# Patient Record
Sex: Male | Born: 1980 | Race: White | Hispanic: No | Marital: Married | Smoking: Current every day smoker
Health system: Southern US, Community
[De-identification: ages and names within clinical notes are randomized; demographics above are authoritative.]

## PROBLEM LIST (undated history)

## (undated) DIAGNOSIS — Z789 Other specified health status: Secondary | ICD-10-CM

## (undated) HISTORY — PX: INCISION AND DRAINAGE ABSCESS ANAL: SUR669

---
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
## 1999-04-26 ENCOUNTER — Emergency Department (HOSPITAL_COMMUNITY): Admission: EM | Admit: 1999-04-26 | Discharge: 1999-04-26 | Payer: Self-pay

## 1999-04-26 ENCOUNTER — Encounter: Payer: Self-pay | Admitting: Emergency Medicine

## 1999-05-02 ENCOUNTER — Ambulatory Visit (HOSPITAL_COMMUNITY): Admission: RE | Admit: 1999-05-02 | Discharge: 1999-05-02 | Payer: Self-pay

## 2004-07-24 HISTORY — PX: ANTERIOR CRUCIATE LIGAMENT REPAIR: SHX115

## 2007-02-01 ENCOUNTER — Encounter: Admission: RE | Admit: 2007-02-01 | Discharge: 2007-02-01 | Payer: Self-pay | Admitting: Orthopedic Surgery

## 2007-02-26 ENCOUNTER — Ambulatory Visit (HOSPITAL_COMMUNITY): Admission: RE | Admit: 2007-02-26 | Discharge: 2007-02-27 | Payer: Self-pay | Admitting: Orthopedic Surgery

## 2010-12-06 NOTE — Op Note (Signed)
Allen [name]                  ACCOUNT NO.:  [id number]   MEDICAL RECORD NO.:  [id number]          PATIENT TYPE:  OIB   LOCATION:  5038                         FACILITY:  [facility]   PHYSICIAN:  Burnard Bunting, M.D.    DATE OF BIRTH:  Jul 24, 1981   DATE OF PROCEDURE:  02/26/2007  DATE OF DISCHARGE:                               OPERATIVE REPORT   PREOPERATIVE DIAGNOSIS:  Right knee anterior cruciate ligament, loose  bodies, medial meniscal tear.   POSTOPERATIVE DIAGNOSIS:  Right knee anterior cruciate ligament, loose  bodies, medial meniscal tear.   PROCEDURE:  1. Right knee anterior cruciate ligament reconstruction.  2. Removal of loose bodies x5  each measuring greater than 5 x 5 mm.  3. Microfracture medial femoral condyle for full thickness chondral      defects.  4. Inside-out medial meniscal tear repair.   SURGEON:  Burnard Bunting, M.D.   ASSISTANT:  Jerolyn Shin. Tresa Res, M.D.   ANESTHESIA:  General endotracheal.   ESTIMATED BLOOD LOSS:  30 mL.   DRAINS:  None.   TOURNIQUET TIME:  2 hours at 300 mmHg.   INDICATIONS:  Allen [name] is a 30 year old patient with severe right knee  injury, MRI scan consistent with meniscal tear, loose bodies, chondral  defect and medial meniscal tear and ACL tear.  He presents now for  operative management after symptomatic knee instability.   OPERATIVE FINDINGS:  1. Examination under anesthesia range of motion 0 to 130 with      stability to varus-valgus stress PCL intact.  No __________      instability is noted.  ACL without, with about 4 mm anterior drawer      with a soft endpoint.  2. Diagnostic arthroscopy.  3. Intact patellofemoral compartment.  4. A significant loose bodies within the anterior compartment and in      the lateral gutter and the lateral compartment, approximately five      of these bodies, each measuring each measuring minimal 5 x 5, the      large measuring one x 1 cm.  5. Intact lateral compartment articular  cartilage and meniscus but      with two loose bodies present.  6. Two loose bodies in the anterior compartment with torn ACL and      intact PCL.  7. Tear of the medial meniscus through the red and white zone      involving about 70% of the anterior-posterior width of the meniscus      in the posterior horn with favorable configuration for repair.   PROCEDURE IN DETAIL:  Patient was brought to operating room where  general endotracheal anesthesia was used.  Preoperative antibiotics  administered.  The right leg was prepped with DuraPrep solution and  draped in a sterile manner including the foot.  Operative fields covered  with Ioban.  The leg was elevated and exsanguinated with the Esmarch  wrap.  Tourniquet was inflated.  The foot of the bed was dropped, the  left leg was in lithotomy position and the heel and peroneal nerve well-  padded.  Because of the patient's instability, an anterior incision  beginning at the inferior pole of patella and extending to the tibial  tubercle was made.  Skin and subcutaneous tissue sharply divided.  Tenon  was split.  The graft was harvested using a double wide 10 mm knife.  Bone plugs were fashioned 10 x 23 mm, one #5 Ethibond was placed in the  __________ of what would be the femoral bone plug.  Two #2 FiberWires  were placed through what would be the tibial bone plug.  Patellar defect  was bone grafted, patellar tendon was closed, peritenon was closed.  Through the incision the anterior inferolateral portals were  established.  Anterior inferior medial portals were established under  visualization.  Diagnostic arthroscopy was performed.  Loose bodies x5  were removed to go through a separate medial portal incision.  The loose  bodies were large and came from a chondral defect on the medial femoral  condyle.  Patellofemoral compartment was intact.  Loose bodies were  present in the lateral compartment.  The articular cartilage and  meniscus was  intact. ACL was 95% torn.  Stump was debrided, over-the-top  position identified.  Notchplasty was performed.  Medial meniscal tear  was then inspected and found to be potentially repairable especially in  the light of two significant chondral defects, each measuring about 1 x  1 cm in the posterior weightbearing surface of the medial femoral  condyle.  For this reason it was elected to try to repair that medial  meniscus.  The rasp was then placed, the junction of the meniscal horns  and the tear.  Fibrous tissue was removed.  Chondral flaps were then  debrided off of the medial femoral condyle.  Microfracture was performed  after removing calcified chondral cartilage layer of the exposed bone.  The circumference of the majority of chondral lesions were well  shouldered.  At this time incision was made in the posteromedial aspect  of the knee.  Skin and subcutaneous tissue were sharply divided.  The  knee incision was centered at the joint line.  Care was taken to avoid  injury to branches of the saphenous nerve.  Pes tendon was split.  Pes  anserine tendon was split longitudinally.  Graves retractor was placed.  The joint line was then visualized.  Four vertical mattress sutures were  placed inside-out fashion using a 2-0 FiberWire suture.  These sutures  were then tied with the knee in about 10 degrees of extension.  This  incision was then irrigated and closed using interrupted inverted 0-0  Vicryl suture, 2-0 Vicryl suture and then running 3-0 pullout Prolene.  At this time attention was redirected towards the drilling of the tibial  tunnel which was made in about a 50 degrees coronal angle to the  perpendicular axis of the tibia, 10-mm tunnel was drilled over a guide  pin.  Femoral tunnel was then placed at about the 10 o'clock position.  The back wall was visualized and found to be intact.  The graft was  passed and secured on femoral side using 8 x 23 mm bioabsorbable screw.  The  patient had good range of motion and stability and good graft  isometry.  The tibial bone plug was then secured to the tibia tying  sutures over 6.5 x 30 mm post.  At this time the knee was taken through  a range of motion and found to be stable with no impingement of the  graft in full extension.  Tourniquet was released.  Bleeding points  encountered were controlled using electrocautery.  Knee joint was  thoroughly irrigated.  Knee joint was then closed using interrupted  inverted 0-0 Vicryl suture, 2-0 Vicryl suture and 3-0 pullout Prolene.  Solution of Marcaine, morphine and clonidine was injected into the knee.  Bulky dressing and knee immobilizer were applied.  The patient tolerated  well without immediate complications.  It should be noted that Dr.  Lenny Pastel assistance was of medical necessity during this case due to  the complexity of the procedure and for retraction of important  neurovascular structures.      Burnard Bunting, M.D.  Electronically Signed     GSD/MEDQ  D:  02/26/2007  T:  02/27/2007  Job:  [id number]

## 2011-05-08 LAB — CBC
MCHC: 34.5
MCV: 89.5
Platelets: 181
RDW: 12.6

## 2011-05-08 LAB — BASIC METABOLIC PANEL
BUN: 10
CO2: 27
Calcium: 9.6
Creatinine, Ser: 0.92
GFR calc Af Amer: >60
Glucose, Bld: 85

## 2011-05-08 LAB — DIFFERENTIAL
Basophils Absolute: 0
Basophils Relative: 0
Eosinophils Absolute: 0.2
Neutro Abs: 7.9 — ABNORMAL HIGH
Neutrophils Relative %: 71

## 2017-10-30 ENCOUNTER — Other Ambulatory Visit: Payer: Self-pay | Admitting: Physician Assistant

## 2017-10-30 DIAGNOSIS — N5089 Other specified disorders of the male genital organs: Secondary | ICD-10-CM

## 2017-10-31 ENCOUNTER — Ambulatory Visit
Admission: RE | Admit: 2017-10-31 | Discharge: 2017-10-31 | Disposition: A | Discharge status: Home / Self Care | Payer: BLUE CROSS/BLUE SHIELD | Source: Ambulatory Visit | Attending: Physician Assistant | Admitting: Physician Assistant

## 2017-10-31 DIAGNOSIS — N5089 Other specified disorders of the male genital organs: Secondary | ICD-10-CM

## 2020-09-14 ENCOUNTER — Other Ambulatory Visit: Payer: Self-pay | Admitting: Physician Assistant

## 2020-09-14 DIAGNOSIS — Q740 Other congenital malformations of upper limb(s), including shoulder girdle: Secondary | ICD-10-CM

## 2020-09-29 ENCOUNTER — Ambulatory Visit
Admission: RE | Admit: 2020-09-29 | Discharge: 2020-09-29 | Disposition: A | Discharge status: Home / Self Care | Payer: 59 | Source: Ambulatory Visit | Attending: Physician Assistant | Admitting: Physician Assistant

## 2020-09-29 ENCOUNTER — Other Ambulatory Visit: Payer: Self-pay

## 2020-09-29 DIAGNOSIS — Q740 Other congenital malformations of upper limb(s), including shoulder girdle: Secondary | ICD-10-CM

## 2021-03-15 ENCOUNTER — Ambulatory Visit: Payer: Self-pay | Admitting: General Surgery

## 2021-03-15 NOTE — H&P (View-Only) (Signed)
  REFERRING PHYSICIAN:  Self  PROVIDER:  Elenora Gamma, MD  MRN: [id number] DOB: 1981-06-23 DATE OF ENCOUNTER: 03/15/2021  Subjective   Chief Complaint: anal lesion     History of Present Illness: Allen [name] is a 40 y.o. male who is seen today for evaluation of an anal lesion.  Patient states he has a history of a perirectal abscess status post I&D several years ago.  It did not give him any further issues until recently when he developed some significant perianal pain after riding jet skis.  He saw our PA here in the office and clot evacuation was performed.  She was concerned for possible perianal fistula as well and wanted me to evaluate him further.  He states that his pain resolved quite quickly after evacuation of blood clots.  He denies any straining prior to the event.  He reports regular bowel habits.    Review of Systems: A complete review of systems was obtained from the patient.  I have reviewed this information and discussed as appropriate with the patient.  See HPI as well for other ROS.    Medical History: History reviewed. No pertinent past medical history.  There is no problem list on file for this patient.   Past Surgical History:  Procedure Laterality Date   ARTHROSCOPIC REPAIR ACL       No Known Allergies  No current outpatient medications on file prior to visit.   No current facility-administered medications on file prior to visit.    Family History  Problem Relation Age of Onset   Diabetes Mother      Social History   Tobacco Use  Smoking Status Current Every Day Smoker   Packs/day: 1.00   Types: Cigarettes  Smokeless Tobacco Never Used     Social History   Socioeconomic History   Marital status: Unknown  Tobacco Use   Smoking status: Current Every Day Smoker    Packs/day: 1.00    Types: Cigarettes   Smokeless tobacco: Never Used  Substance and Sexual Activity   Alcohol use: Never   Drug use: Never    Objective:     There were no vitals filed for this visit.   Exam Gen: NAD CV: RRR Lungs: CTA Abd: soft Rectal: Right anterior punctate lesion.  Fistula probe inserted but does not traverse towards the anal canal.  No signs of active infection or drainage.   Assessment and Plan:  Diagnoses and all orders for this visit:  Anal lesion    I believe this to be a sebaceous cyst although we did discuss the possibility of this being an an anal rectal fistula.  I recommended exam under anesthesia with excision if it is a cyst and fistulotomy if it is a shallow fistula.  If a deeper fistula is noted we will place a seton.  We discussed this in detail including postoperative pain and healing times.  All questions were answered.  Risk include bleeding, pain and recurrence.

## 2021-03-15 NOTE — H&P (Signed)
  REFERRING PHYSICIAN:  Self  PROVIDER:  Elenora Gamma, MD  MRN: [id number] DOB: 1981-06-23 DATE OF ENCOUNTER: 03/15/2021  Subjective   Chief Complaint: anal lesion     History of Present Illness: Allen [name] is a 40 y.o. male who is seen today for evaluation of an anal lesion.  Patient states he has a history of a perirectal abscess status post I&D several years ago.  It did not give him any further issues until recently when he developed some significant perianal pain after riding jet skis.  He saw our PA here in the office and clot evacuation was performed.  She was concerned for possible perianal fistula as well and wanted me to evaluate him further.  He states that his pain resolved quite quickly after evacuation of blood clots.  He denies any straining prior to the event.  He reports regular bowel habits.    Review of Systems: A complete review of systems was obtained from the patient.  I have reviewed this information and discussed as appropriate with the patient.  See HPI as well for other ROS.    Medical History: History reviewed. No pertinent past medical history.  There is no problem list on file for this patient.   Past Surgical History:  Procedure Laterality Date   ARTHROSCOPIC REPAIR ACL       No Known Allergies  No current outpatient medications on file prior to visit.   No current facility-administered medications on file prior to visit.    Family History  Problem Relation Age of Onset   Diabetes Mother      Social History   Tobacco Use  Smoking Status Current Every Day Smoker   Packs/day: 1.00   Types: Cigarettes  Smokeless Tobacco Never Used     Social History   Socioeconomic History   Marital status: Unknown  Tobacco Use   Smoking status: Current Every Day Smoker    Packs/day: 1.00    Types: Cigarettes   Smokeless tobacco: Never Used  Substance and Sexual Activity   Alcohol use: Never   Drug use: Never    Objective:     There were no vitals filed for this visit.   Exam Gen: NAD CV: RRR Lungs: CTA Abd: soft Rectal: Right anterior punctate lesion.  Fistula probe inserted but does not traverse towards the anal canal.  No signs of active infection or drainage.   Assessment and Plan:  Diagnoses and all orders for this visit:  Anal lesion    I believe this to be a sebaceous cyst although we did discuss the possibility of this being an an anal rectal fistula.  I recommended exam under anesthesia with excision if it is a cyst and fistulotomy if it is a shallow fistula.  If a deeper fistula is noted we will place a seton.  We discussed this in detail including postoperative pain and healing times.  All questions were answered.  Risk include bleeding, pain and recurrence.

## 2021-04-04 ENCOUNTER — Encounter (HOSPITAL_BASED_OUTPATIENT_CLINIC_OR_DEPARTMENT_OTHER): Payer: Self-pay | Admitting: General Surgery

## 2021-04-04 ENCOUNTER — Other Ambulatory Visit: Payer: Self-pay

## 2021-04-04 NOTE — Progress Notes (Signed)
Spoke w/ via phone for pre-op interview--- Allen [name] needs dos----  NONE             [name] results------ COVID test -----patient states asymptomatic no test needed Arrive at ------- 1300 NPO after MN NO Solid Food.  Clear liquids from MN until--- 0900 Med rec completed Medications to take morning of surgery ----- NONE Diabetic medication ----- Patient instructed no nail polish to be worn day of surgery Patient instructed to bring photo id and insurance card day of surgery Patient aware to have Driver (ride ) / caregiver    for 24 hours after surgery  Patient Special Instructions ----- Pre-Op special Istructions ----- Patient verbalized understanding of instructions that were given at this phone interview. Patient denies shortness of breath, chest pain, fever, cough at this phone interview.

## 2021-04-07 ENCOUNTER — Ambulatory Visit (HOSPITAL_BASED_OUTPATIENT_CLINIC_OR_DEPARTMENT_OTHER): Payer: 59 | Admitting: Anesthesiology

## 2021-04-07 ENCOUNTER — Other Ambulatory Visit: Payer: Self-pay

## 2021-04-07 ENCOUNTER — Encounter (HOSPITAL_BASED_OUTPATIENT_CLINIC_OR_DEPARTMENT_OTHER)
Admission: RE | Disposition: A | Discharge status: Home / Self Care | Payer: Self-pay | Source: Ambulatory Visit | Attending: General Surgery

## 2021-04-07 ENCOUNTER — Encounter (HOSPITAL_BASED_OUTPATIENT_CLINIC_OR_DEPARTMENT_OTHER): Payer: Self-pay | Admitting: General Surgery

## 2021-04-07 ENCOUNTER — Ambulatory Visit (HOSPITAL_BASED_OUTPATIENT_CLINIC_OR_DEPARTMENT_OTHER)
Admission: RE | Admit: 2021-04-07 | Discharge: 2021-04-07 | Disposition: A | Discharge status: Home / Self Care | Payer: 59 | Source: Ambulatory Visit | Attending: General Surgery | Admitting: General Surgery

## 2021-04-07 DIAGNOSIS — K629 Disease of anus and rectum, unspecified: Secondary | ICD-10-CM | POA: Diagnosis present

## 2021-04-07 DIAGNOSIS — F17210 Nicotine dependence, cigarettes, uncomplicated: Secondary | ICD-10-CM | POA: Insufficient documentation

## 2021-04-07 DIAGNOSIS — K6289 Other specified diseases of anus and rectum: Secondary | ICD-10-CM | POA: Insufficient documentation

## 2021-04-07 HISTORY — PX: RECTAL EXAM UNDER ANESTHESIA: SHX6399

## 2021-04-07 HISTORY — DX: Other specified health status: Z78.9

## 2021-04-07 HISTORY — PX: FISTULOTOMY: SHX6413

## 2021-04-07 SURGERY — EXAM UNDER ANESTHESIA, RECTUM
Anesthesia: Monitor Anesthesia Care

## 2021-04-07 MED ORDER — BUPIVACAINE-EPINEPHRINE 0.5% -1:200000 IJ SOLN
INTRAMUSCULAR | Status: DC | PRN
  Administered 2021-04-07: 30 mL

## 2021-04-07 MED ORDER — SODIUM CHLORIDE 0.9 % IV SOLN: 250.0000 mL | INTRAVENOUS | Status: DC | PRN

## 2021-04-07 MED ORDER — OXYCODONE HCL 5 MG/5ML PO SOLN: 5.0000 mg | Freq: Once | ORAL | Status: DC | PRN

## 2021-04-07 MED ORDER — MIDAZOLAM HCL 5 MG/5ML IJ SOLN
INTRAMUSCULAR | Status: DC | PRN
  Administered 2021-04-07: 2 mg via INTRAVENOUS

## 2021-04-07 MED ORDER — FENTANYL CITRATE (PF) 100 MCG/2ML IJ SOLN: 25.0000 ug | INTRAMUSCULAR | Status: DC | PRN

## 2021-04-07 MED ORDER — PROPOFOL 10 MG/ML IV BOLUS
INTRAVENOUS | Status: DC | PRN
  Administered 2021-04-07: 50 mg via INTRAVENOUS

## 2021-04-07 MED ORDER — PROMETHAZINE HCL 25 MG/ML IJ SOLN: 6.2500 mg | INTRAMUSCULAR | Status: DC | PRN

## 2021-04-07 MED ORDER — ACETAMINOPHEN 500 MG PO TABS: 1000.0000 mg | ORAL_TABLET | ORAL | Status: DC

## 2021-04-07 MED ORDER — SODIUM CHLORIDE 0.9% FLUSH: 3.0000 mL | Freq: Two times a day (BID) | INTRAVENOUS | Status: DC

## 2021-04-07 MED ORDER — FENTANYL CITRATE (PF) 100 MCG/2ML IJ SOLN
INTRAMUSCULAR | Status: AC
  Filled 2021-04-07: qty 2

## 2021-04-07 MED ORDER — SODIUM CHLORIDE 0.9% FLUSH: 3.0000 mL | INTRAVENOUS | Status: DC | PRN

## 2021-04-07 MED ORDER — PROPOFOL 500 MG/50ML IV EMUL
INTRAVENOUS | Status: DC | PRN
  Administered 2021-04-07: 125 ug/kg/min via INTRAVENOUS

## 2021-04-07 MED ORDER — LIDOCAINE 2% (20 MG/ML) 5 ML SYRINGE
INTRAMUSCULAR | Status: DC | PRN
  Administered 2021-04-07: 100 mg via INTRAVENOUS

## 2021-04-07 MED ORDER — LACTATED RINGERS IV SOLN: INTRAVENOUS | Status: DC

## 2021-04-07 MED ORDER — MIDAZOLAM HCL 2 MG/2ML IJ SOLN
INTRAMUSCULAR | Status: AC
  Filled 2021-04-07: qty 4

## 2021-04-07 MED ORDER — LIDOCAINE HCL (PF) 2 % IJ SOLN
INTRAMUSCULAR | Status: AC
  Filled 2021-04-07: qty 5

## 2021-04-07 MED ORDER — ACETAMINOPHEN 500 MG PO TABS
ORAL_TABLET | ORAL | Status: AC
  Filled 2021-04-07: qty 2

## 2021-04-07 MED ORDER — MEPERIDINE HCL 25 MG/ML IJ SOLN: 6.2500 mg | INTRAMUSCULAR | Status: DC | PRN

## 2021-04-07 MED ORDER — OXYCODONE HCL 5 MG PO TABS: 5.0000 mg | ORAL_TABLET | ORAL | Status: DC | PRN

## 2021-04-07 MED ORDER — LIDOCAINE 5 % EX OINT
TOPICAL_OINTMENT | CUTANEOUS | Status: DC | PRN
  Administered 2021-04-07: 1

## 2021-04-07 MED ORDER — ACETAMINOPHEN 325 MG PO TABS: 650.0000 mg | ORAL_TABLET | ORAL | Status: DC | PRN

## 2021-04-07 MED ORDER — OXYCODONE HCL 5 MG PO TABS: 5.0000 mg | ORAL_TABLET | Freq: Four times a day (QID) | ORAL | 0 refills | Status: AC | PRN

## 2021-04-07 MED ORDER — OXYCODONE HCL 5 MG PO TABS
5.0000 mg | ORAL_TABLET | Freq: Once | ORAL | Status: DC | PRN
Start: 2021-04-07 — End: 2021-04-07

## 2021-04-07 MED ORDER — ACETAMINOPHEN 325 MG RE SUPP: 650.0000 mg | RECTAL | Status: DC | PRN

## 2021-04-07 MED ORDER — FENTANYL CITRATE (PF) 100 MCG/2ML IJ SOLN
INTRAMUSCULAR | Status: DC | PRN
  Administered 2021-04-07: 50 ug via INTRAVENOUS

## 2021-04-07 MED ORDER — 0.9 % SODIUM CHLORIDE (POUR BTL) OPTIME
TOPICAL | Status: DC | PRN
  Administered 2021-04-07: 1000 mL

## 2021-04-07 MED ORDER — ACETAMINOPHEN 500 MG PO TABS
1000.0000 mg | ORAL_TABLET | ORAL | Status: AC
  Administered 2021-04-07: 1000 mg via ORAL

## 2021-04-07 SURGICAL SUPPLY — 49 items
APL SKNCLS STERI-STRIP NONHPOA (GAUZE/BANDAGES/DRESSINGS) ×2
BENZOIN TINCTURE PRP APPL 2/3 (GAUZE/BANDAGES/DRESSINGS) ×4 IMPLANT
BLADE EXTENDED COATED 6.5IN (ELECTRODE) IMPLANT
BLADE SURG 10 STRL SS (BLADE) IMPLANT
COVER BACK TABLE 60X90IN (DRAPES) ×2 IMPLANT
COVER MAYO STAND STRL (DRAPES) ×2 IMPLANT
DECANTER SPIKE VIAL GLASS SM (MISCELLANEOUS) IMPLANT
DRAPE LAPAROTOMY 100X72 PEDS (DRAPES) ×2 IMPLANT
DRAPE UTILITY XL STRL (DRAPES) ×2 IMPLANT
DRSG PAD ABDOMINAL 8X10 ST (GAUZE/BANDAGES/DRESSINGS) ×2 IMPLANT
ELECT REM PT RETURN 9FT ADLT (ELECTROSURGICAL) ×2
ELECTRODE REM PT RTRN 9FT ADLT (ELECTROSURGICAL) ×1 IMPLANT
GAUZE 4X4 16PLY ~~LOC~~+RFID DBL (SPONGE) ×2 IMPLANT
GAUZE SPONGE 4X4 12PLY STRL (GAUZE/BANDAGES/DRESSINGS) ×2 IMPLANT
GAUZE SPONGE 4X4 12PLY STRL LF (GAUZE/BANDAGES/DRESSINGS) ×2 IMPLANT
GLOVE SURG ENC MOIS LTX SZ6.5 (GLOVE) ×2 IMPLANT
GLOVE SURG UNDER LTX SZ6.5 (GLOVE) ×2 IMPLANT
GOWN STRL REUS W/TWL XL LVL3 (GOWN DISPOSABLE) ×2 IMPLANT
HYDROGEN PEROXIDE 16OZ (MISCELLANEOUS) IMPLANT
IV CATH 14GX2 1/4 (CATHETERS) IMPLANT
IV CATH 18G SAFETY (IV SOLUTION) IMPLANT
KIT SIGMOIDOSCOPE (SET/KITS/TRAYS/PACK) IMPLANT
KIT TURNOVER CYSTO (KITS) ×2 IMPLANT
LOOP VESSEL MAXI BLUE (MISCELLANEOUS) IMPLANT
NEEDLE HYPO 22GX1.5 SAFETY (NEEDLE) ×2 IMPLANT
NS IRRIG 500ML POUR BTL (IV SOLUTION) ×2 IMPLANT
PACK BASIN DAY SURGERY FS (CUSTOM PROCEDURE TRAY) ×2 IMPLANT
PAD ARMBOARD 7.5X6 YLW CONV (MISCELLANEOUS) IMPLANT
PANTS MESH DISP LRG (UNDERPADS AND DIAPERS) ×1 IMPLANT
PANTS MESH DISPOSABLE L (UNDERPADS AND DIAPERS) ×1
PENCIL SMOKE EVACUATOR (MISCELLANEOUS) ×2 IMPLANT
SPONGE HEMORRHOID 8X3CM (HEMOSTASIS) IMPLANT
SPONGE SURGIFOAM ABS GEL 12-7 (HEMOSTASIS) IMPLANT
SUCTION FRAZIER HANDLE 10FR (MISCELLANEOUS)
SUCTION TUBE FRAZIER 10FR DISP (MISCELLANEOUS) IMPLANT
SUT CHROMIC 2 0 SH (SUTURE) IMPLANT
SUT CHROMIC 3 0 SH 27 (SUTURE) IMPLANT
SUT ETHIBOND 0 (SUTURE) IMPLANT
SUT VIC AB 2-0 SH 18 (SUTURE) ×2 IMPLANT
SUT VIC AB 2-0 SH 27 (SUTURE)
SUT VIC AB 2-0 SH 27XBRD (SUTURE) IMPLANT
SUT VIC AB 3-0 SH 18 (SUTURE) IMPLANT
SUT VIC AB 3-0 SH 27 (SUTURE)
SUT VIC AB 3-0 SH 27XBRD (SUTURE) IMPLANT
SYR CONTROL 10ML LL (SYRINGE) ×2 IMPLANT
TOWEL OR 17X26 10 PK STRL BLUE (TOWEL DISPOSABLE) ×2 IMPLANT
TRAY DSU PREP LF (CUSTOM PROCEDURE TRAY) ×2 IMPLANT
TUBE CONNECTING 12X1/4 (SUCTIONS) ×2 IMPLANT
YANKAUER SUCT BULB TIP NO VENT (SUCTIONS) ×2 IMPLANT

## 2021-04-07 NOTE — Anesthesia Procedure Notes (Signed)
Procedure Name: MAC Date/Time: 04/07/2021 3:43 PM Performed by: Bonney Aid, CRNA Pre-anesthesia Checklist: Patient identified, Emergency Drugs available, Suction available, Patient being monitored and Timeout performed Patient Re-evaluated:Patient Re-evaluated prior to induction Oxygen Delivery Method: Nasal cannula Placement Confirmation: positive ETCO2

## 2021-04-07 NOTE — Transfer of Care (Signed)
Immediate Anesthesia Transfer of Care Note  Patient: Allen [name]  Procedure(s) Performed: RECTAL EXAM UNDER ANESTHESIA EXCISION OF ANAL CYST  Patient Location: PACU  Anesthesia Type:MAC  Level of Consciousness: awake, alert  and oriented  Airway & Oxygen Therapy: Patient Spontanous Breathing and Patient connected to nasal cannula oxygen  Post-op Assessment: Report given to RN  Post vital signs: Reviewed and stable  Last Vitals:  Vitals Value Taken Time  BP 108/59   Temp    Pulse 67 04/07/21 1609  Resp 18 04/07/21 1609  SpO2 98 % 04/07/21 1609  Vitals shown include unvalidated device data.  Last Pain:  Vitals:   04/07/21 1323  TempSrc: Oral  PainSc: 0-No pain      Patients Stated Pain Goal: 4 (15/17/61 6073)  Complications: No notable events documented.

## 2021-04-07 NOTE — Discharge Instructions (Addendum)
ANORECTAL SURGERY: POST OP INSTRUCTIONS Take your usually prescribed home medications unless otherwise directed. DIET: During the first few hours after surgery sip on some liquids until you are able to urinate.  It is normal to not urinate for several hours after this surgery.  If you feel uncomfortable, please contact the office for instructions.  After you are able to urinate,you may eat, if you feel like it.  Follow a light bland diet the first 24 hours after arrival home, such as soup, liquids, crackers, etc.  Be sure to include lots of fluids daily (6-8 glasses).  Avoid fast food or heavy meals, as your are more likely to get nauseated.  Eat a low fat diet the next few days after surgery.  Limit caffeine intake to 1-2 servings a day. PAIN CONTROL: Pain is best controlled by a usual combination of several different methods TOGETHER: Muscle relaxation: Soak in a warm bath (or Sitz bath) three times a day and after bowel movements.  Continue to do this until all pain is resolved. Over the counter pain medication Prescription pain medication Most patients will experience some swelling and discomfort in the anus/rectal area and incisions.  Heat such as warm towels, sitz baths, warm baths, etc to help relax tight/sore spots and speed recovery.  Some people prefer to use ice, especially in the first couple days after surgery, as it may decrease the pain and swelling, or alternate between ice & heat.  Experiment to what works for you.  Swelling and bruising can take several weeks to resolve.  Pain can take even longer to completely resolve. It is helpful to take an over-the-counter pain medication regularly for the first few weeks.  Choose one of the following that works best for you: Naproxen (Aleve, etc)  Two 220mg tabs twice a day Ibuprofen (Advil, etc) Three 200mg tabs four times a day (every meal & bedtime) A  prescription for pain medication (such as percocet, oxycodone, hydrocodone, etc) should be  given to you upon discharge.  Take your pain medication as prescribed.  If you are having problems/concerns with the prescription medicine (does not control pain, nausea, vomiting, rash, itching, etc), please call us [phone] to see if we need to switch you to a different pain medicine that will work better for you and/or control your side effect better. If you need a refill on your pain medication, please contact your pharmacy.  They will contact our office to request authorization. Prescriptions will not be filled after 5 pm or on week-ends. KEEP YOUR BOWELS REGULAR and AVOID CONSTIPATION The goal is one to two soft bowel movements a day.  You should at least have a bowel movement every other day. Avoid getting constipated.  Between the surgery and the pain medications, it is common to experience some constipation. This can be very painful after rectal surgery.  Increasing fluid intake and taking a fiber supplement (such as Metamucil, Citrucel, FiberCon, etc) 1-2 times a day regularly will usually help prevent this problem from occurring.  A stool softener like colace is also recommended.  This can be purchased over the counter at your pharmacy.  You can take it up to 3 times a day.  If you do not have a bowel movement after 24 hrs since your surgery, take one does of milk of magnesia.  If you still haven't had a bowel movement 8-12 hours after that dose, take another dose.  If you don't have a bowel movement 48 hrs after surgery,   purchase a Fleets enema from the drug store and administer gently per package instructions.  If you still are having trouble with your bowel movements after that, please call the office for further instructions. If you develop diarrhea or have many loose bowel movements, simplify your diet to bland foods & liquids for a few days.  Stop any stool softeners and decrease your fiber supplement.  Switching to mild anti-diarrheal medications (Kayopectate, Pepto Bismol) can help.   If this worsens or does not improve, please call us.  Wound Care Remove your bandages before your first bowel movement or 8 hours after surgery.     Remove any wound packing material at this tim,e as well.  You do not need to repack the wound unless instructed otherwise.  Wear an absorbent pad or soft cotton gauze in your underwear to catch any drainage and help keep the area clean. You should change this every 2-3 hours while awake. Keep the area clean and dry.  Bathe / shower every day, especially after bowel movements.  Keep the area clean by showering / bathing over the incision / wound.   It is okay to soak an open wound to help wash it.  Wet wipes or showers / gentle washing after bowel movements is often less traumatic than regular toilet paper. You may have some styrofoam-like soft packing in the rectum which will come out with the first bowel movement.  You will often notice bleeding with bowel movements.  This should slow down by the end of the first week of surgery Expect some drainage.  This should slow down, too, by the end of the first week of surgery.  Wear an absorbent pad or soft cotton gauze in your underwear until the drainage stops. Do Not sit on a rubber or pillow ring.  This can make you symptoms worse.  You may sit on a soft pillow if needed.  ACTIVITIES as tolerated:   You may resume regular (light) daily activities beginning the next day--such as daily self-care, walking, climbing stairs--gradually increasing activities as tolerated.  If you can walk 30 minutes without difficulty, it is safe to try more intense activity such as jogging, treadmill, bicycling, low-impact aerobics, swimming, etc. Save the most intensive and strenuous activity for last such as sit-ups, heavy lifting, contact sports, etc  Refrain from any heavy lifting or straining until you are off narcotics for pain control.   You may drive when you are no longer taking prescription pain medication, you can  comfortably sit for long periods of time, and you can safely maneuver your car and apply brakes. You may have sexual intercourse when it is comfortable.  FOLLOW UP in our office Please call [facility] at [phone] to set up an appointment to see your surgeon in the office for a follow-up appointment approximately 3-4 weeks after your surgery. Make sure that you call for this appointment the day you arrive home to insure a convenient appointment time. 10. IF YOU HAVE DISABILITY OR FAMILY LEAVE FORMS, BRING THEM TO THE OFFICE FOR PROCESSING.  DO NOT GIVE THEM TO YOUR DOCTOR.     WHEN TO CALL us [phone]: Poor pain control Reactions / problems with new medications (rash/itching, nausea, etc)  Fever over 101.5 F (38.5 C) Inability to urinate Nausea and/or vomiting Worsening swelling or bruising Continued bleeding from incision. Increased pain, redness, or drainage from the incision  The clinic staff is available to answer your questions during regular business hours (8:30am-5pm).  Please don't hesitate to call and ask to speak to one of our nurses for clinical concerns.   A surgeon from [facility] is always on call at the hospitals   If you have a medical emergency, go to the nearest emergency room or call 911.    [facility] [address], [place], [place]  [address] ? MAIN: [phone] ? TOLL FREE: [phone] ? FAX [phone] [email or web address]    Post Anesthesia Home Care Instructions  Activity: Get plenty of rest for the remainder of the day. A responsible individual must stay with you for 24 hours following the procedure.  For the next 24 hours, DO NOT: -Drive a car -Advertising copywriter -Drink alcoholic beverages -Take any medication unless instructed by your physician -Make any legal decisions or sign important papers.  Meals: Start with liquid foods such as gelatin or soup. Progress to regular  foods as tolerated. Avoid greasy, spicy, heavy foods. If nausea and/or vomiting occur, drink only clear liquids until the nausea and/or vomiting subsides. Call your physician if vomiting continues.  Special Instructions/Symptoms: Your throat may feel dry or sore from the anesthesia or the breathing tube placed in your throat during surgery. If this causes discomfort, gargle with warm salt water. The discomfort should disappear within 24 hours. .     Tylenol may be taken after 7:30 PM if needed

## 2021-04-07 NOTE — Interval H&P Note (Signed)
History and Physical Interval Note:  04/07/2021 1:15 PM  Allen [name]  has presented today for surgery, with the diagnosis of anal lesion.  The various methods of treatment have been discussed with the patient and family. After consideration of risks, benefits and other options for treatment, the patient has consented to  Procedure(s): RECTAL EXAM UNDER ANESTHESIA (N/A) EXCISION OF ANAL LESION VS FISTULOTOMY VS SETON (N/A) as a surgical intervention.  The patient's history has been reviewed, patient examined, no change in status, stable for surgery.  I have reviewed the patient's chart and labs.  Questions were answered to the patient's satisfaction.     Vanita Panda, MD  Colorectal and General Surgery [facility]

## 2021-04-07 NOTE — Anesthesia Preprocedure Evaluation (Addendum)
Anesthesia Evaluation  Patient identified by MRN, date of birth, ID band Patient awake    Reviewed: Allergy & Precautions, NPO status , Patient's Chart, lab work & pertinent test results  Airway Mallampati: I  TM Distance: >3 FB Neck ROM: Full    Dental  (+) Dental Advisory Given, Teeth Intact   Pulmonary Current Smoker,    Pulmonary exam normal breath sounds clear to auscultation       Cardiovascular negative cardio ROS Normal cardiovascular exam Rhythm:Regular Rate:Normal     Neuro/Psych negative neurological ROS     GI/Hepatic negative GI ROS, Neg liver ROS,   Endo/Other  negative endocrine ROS  Renal/GU negative Renal ROS     Musculoskeletal negative musculoskeletal ROS (+)   Abdominal   Peds  Hematology negative hematology ROS (+)   Anesthesia Other Findings   Reproductive/Obstetrics                            Anesthesia Physical Anesthesia Plan  ASA: 2  Anesthesia Plan: MAC   Post-op Pain Management:    Induction: Intravenous  PONV Risk Score and Plan: 0 and Ondansetron, Propofol infusion, Midazolam and Treatment may vary due to age or medical condition  Airway Management Planned: Natural Airway  Additional Equipment: None  Intra-op Plan:   Post-operative Plan:   Informed Consent: I have reviewed the patients History and Physical, chart, labs and discussed the procedure including the risks, benefits and alternatives for the proposed anesthesia with the patient or authorized representative who has indicated his/her understanding and acceptance.     Dental advisory given  Plan Discussed with: CRNA  Anesthesia Plan Comments:        Anesthesia Quick Evaluation

## 2021-04-07 NOTE — Op Note (Signed)
04/07/2021  4:09 PM  PATIENT:  Allen [name]  40 y.o. male  Patient Care Team: Merwyn Katos as PCP - General (Physician Assistant)  PRE-OPERATIVE DIAGNOSIS:  PERIANAL CYST  POST-OPERATIVE DIAGNOSIS:  PERIANAL CYST  PROCEDURE:  ANAL EXAM UNDER ANESTHESIA EXCISION OF PERIANAL CYST  Surgeon(s): Leighton Ruff, MD  ASSISTANT: none   ANESTHESIA:   local and MAC  SPECIMEN:  Source of Specimen:  infected cyst  DISPOSITION OF SPECIMEN:  PATHOLOGY  COUNTS:  YES  PLAN OF CARE: Discharge to home after PACU  PATIENT DISPOSITION:  PACU - hemodynamically stable.  INDICATION: 40 year old male with a chronically draining perianal sinus   OR FINDINGS: Perianal cyst, possibly ingrown hair.  DESCRIPTION: the patient was identified in the preoperative holding area and taken to the OR where they were laid on the operating room table.  MAC anesthesia was induced without difficulty. The patient was then positioned in prone jackknife position with buttocks gently taped apart.  The patient was then prepped and draped in usual sterile fashion.  SCDs were noted to be in place prior to the initiation of anesthesia. A surgical timeout was performed indicating the correct patient, procedure, positioning and need for preoperative antibiotics.  A rectal block was performed using Marcaine with epinephrine.    I began with a digital rectal exam.  There were internal masses.  I then placed a Hill-Ferguson anoscope into the anal canal and evaluated this completely.  The patient had no internal openings noted.  There were grade 2 internal hemorrhoids at all 3 locations.    I placed an S shaped fistula probe into the external opening.  This entered into a perianal cavity.  It did not appear to traverse towards the anal canal.  I decided to excise this cavity.  This was done using electrocautery.  There was a portion of the cavity that headed towards the patient's anterior perineum.  It abruptly ended  with no sign of granulation tissue crossing the sphincter complex.  I probed the S-shaped fistula probe into this area in multiple locations and could not find any converging fistula or inflammation.  At this point hemostasis was achieved using electrocautery.  The cavity was loosely brought together using interrupted 2-0 Vicryl sutures.  The skin was left open.  The wound was covered with fluffs and a sterile dressing.  The patient was then awakened from anesthesia and sent to the postanesthesia care unit in stable condition.  All counts were correct per operating room staff.

## 2021-04-08 ENCOUNTER — Encounter (HOSPITAL_BASED_OUTPATIENT_CLINIC_OR_DEPARTMENT_OTHER): Payer: Self-pay | Admitting: General Surgery

## 2021-04-08 NOTE — Anesthesia Postprocedure Evaluation (Signed)
Anesthesia Post Note  Patient: Allen [name]  Procedure(s) Performed: RECTAL EXAM UNDER ANESTHESIA EXCISION OF ANAL CYST     Patient location during evaluation: PACU Anesthesia Type: MAC Level of consciousness: awake and alert Pain management: pain level controlled Vital Signs Assessment: post-procedure vital signs reviewed and stable Respiratory status: spontaneous breathing Cardiovascular status: stable Anesthetic complications: no   No notable events documented.  Last Vitals:  Vitals:   04/07/21 1630 04/07/21 1700  BP: 111/66 115/79  Pulse: 64 (!) 59  Resp: 20 16  Temp:  36.5 C  SpO2: 99% 100%    Last Pain:  Vitals:   04/07/21 1700  TempSrc:   PainSc: 0-No pain                 Nolon Nations

## 2021-04-11 LAB — SURGICAL PATHOLOGY

## 2022-09-10 IMAGING — CT CT CHEST W/O CM
2 of 3 series · 14 of 30 positions shown, 17 images · non-contrast
Comparison: Chest x-ray dated April 18, 2016.

CLINICAL DATA: Chronic right clavicle prominence.

EXAM:
CT CHEST WITHOUT CONTRAST
TECHNIQUE: Multidetector CT imaging of the chest was performed following the
standard protocol without IV contrast.

[Series 2: chest w/(date) · axial · 0.96mm/px · z∈[-346,-38]mm · 12 of 186 slices shown, 15 images]
[im 16/186  mediastinal]
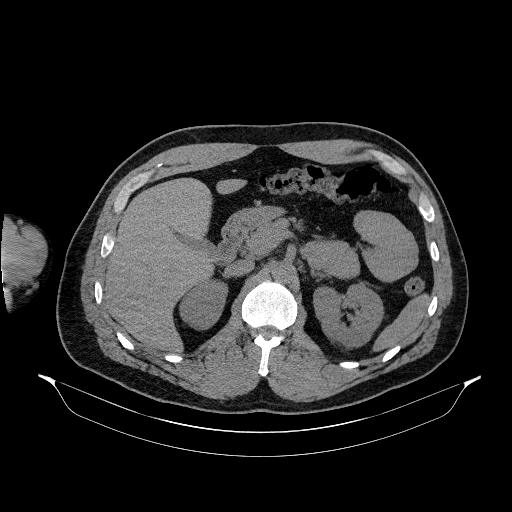
[im 16/186  lung]
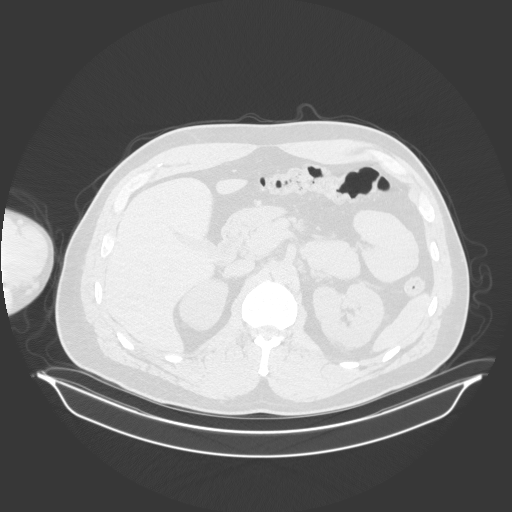
[im 31/186  lung]
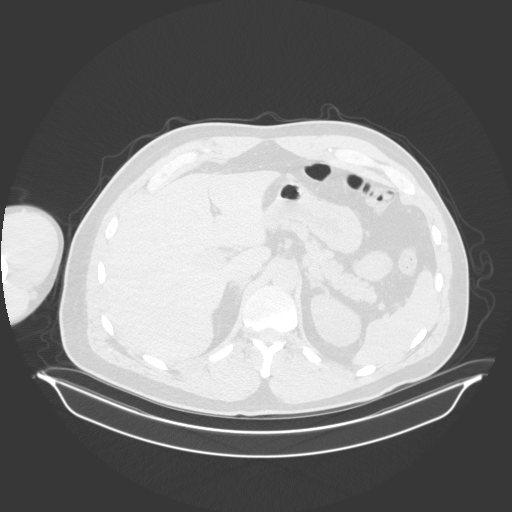
[im 47/186  lung]
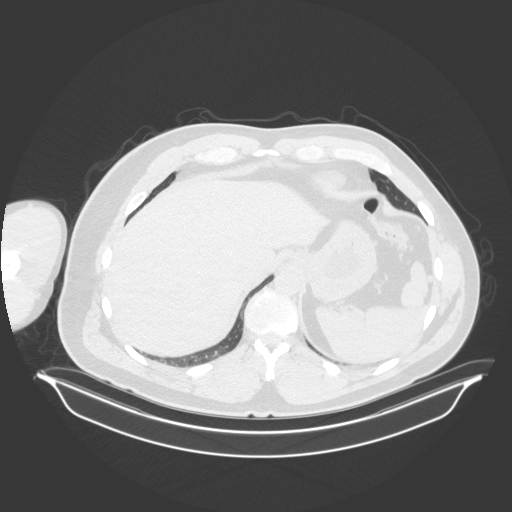
[im 62/186  lung]
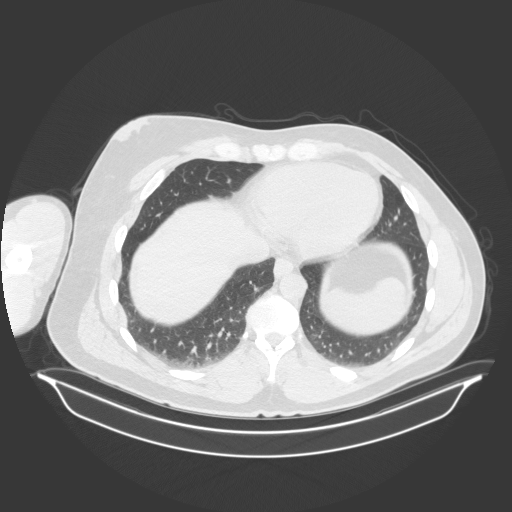
[im 78/186  mediastinal]
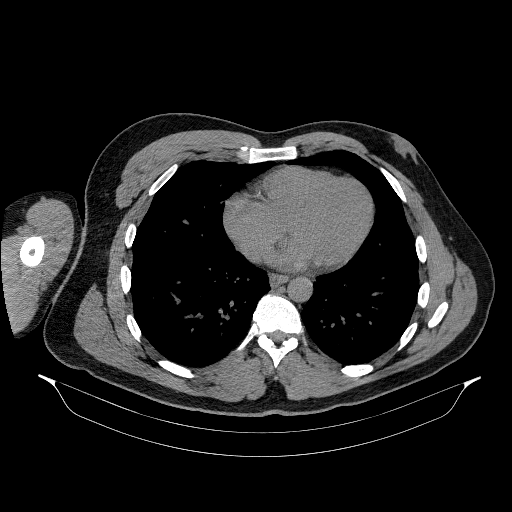
[im 78/186  lung]
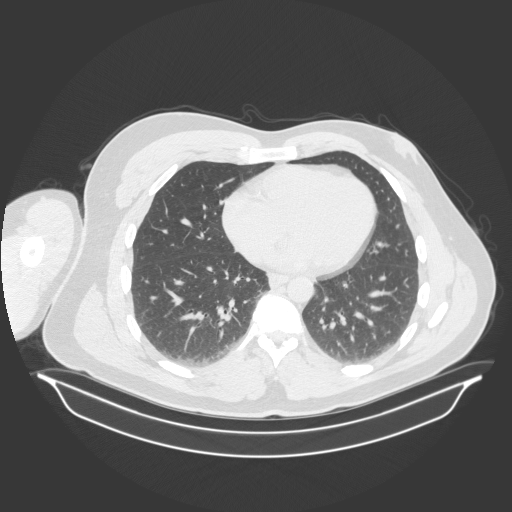
[im 93/186  lung]
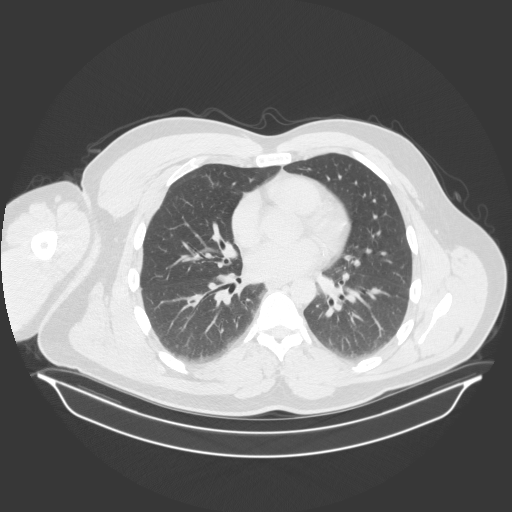
[im 104/186  lung]
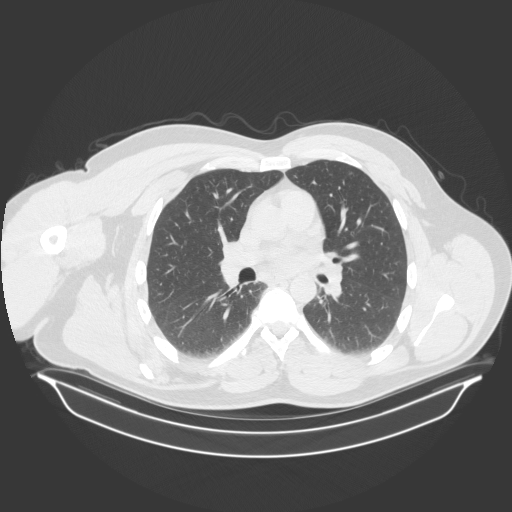
[im 108/186  lung]
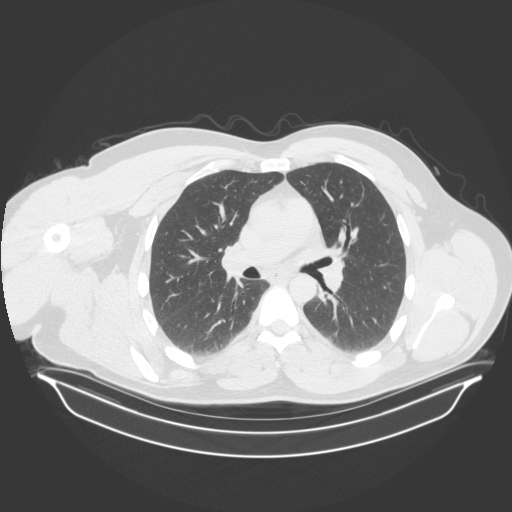
[im 124/186  mediastinal]
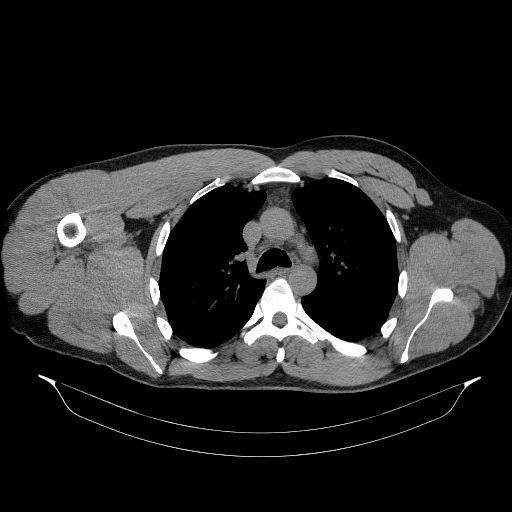
[im 124/186  lung]
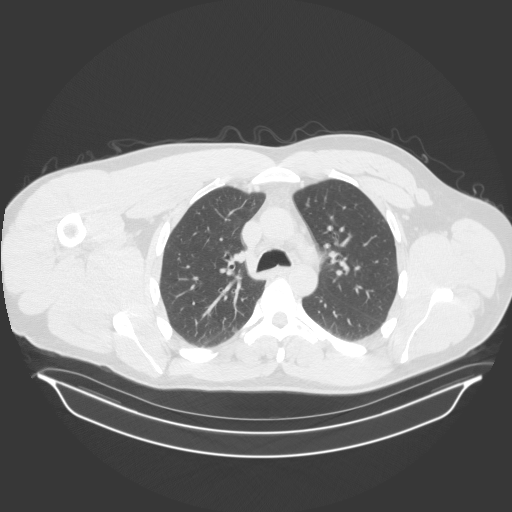
[im 139/186  lung]
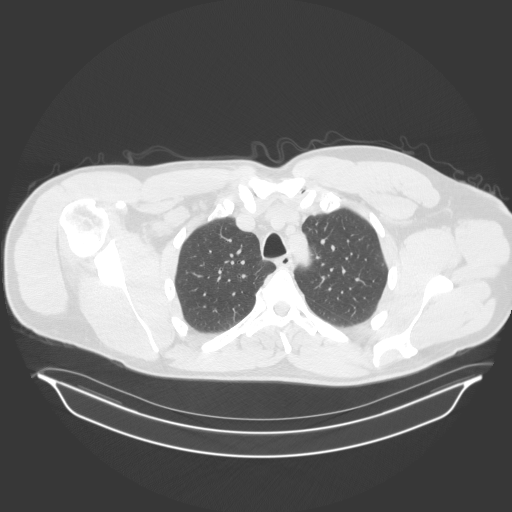
[im 155/186  lung]
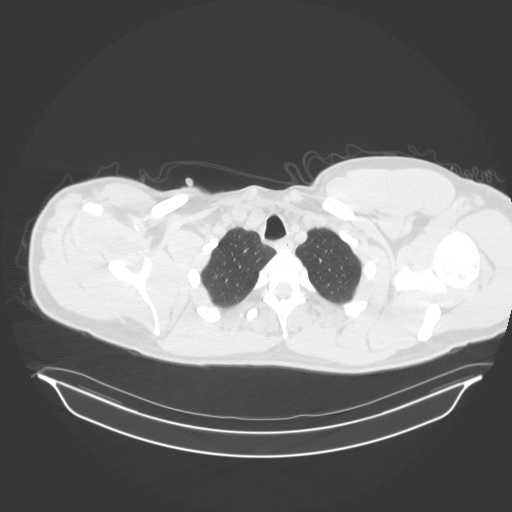
[im 170/186  lung]
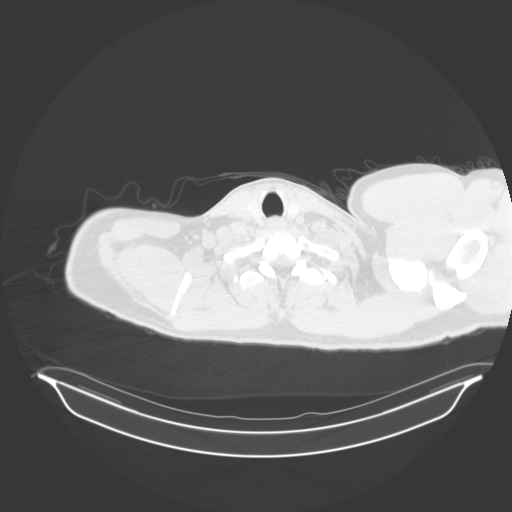

[Series 9: ax bone clavicle · axial · 0.21mm/px · z∈[-90,-48]mm · 2 of 64 slices shown]
[im 22/64  lung]
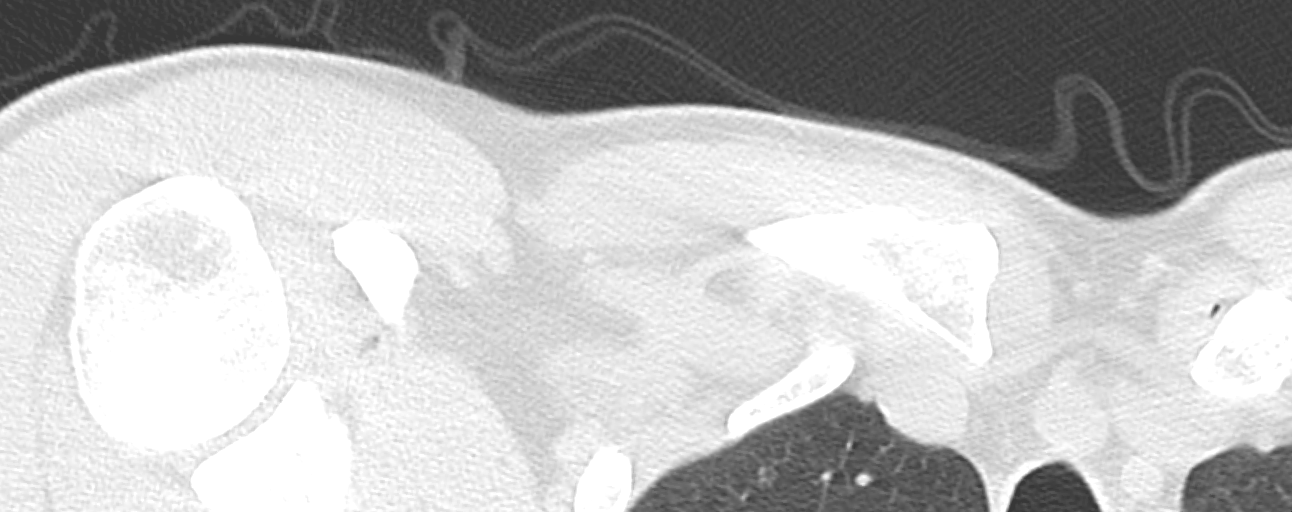
[im 43/64  lung]
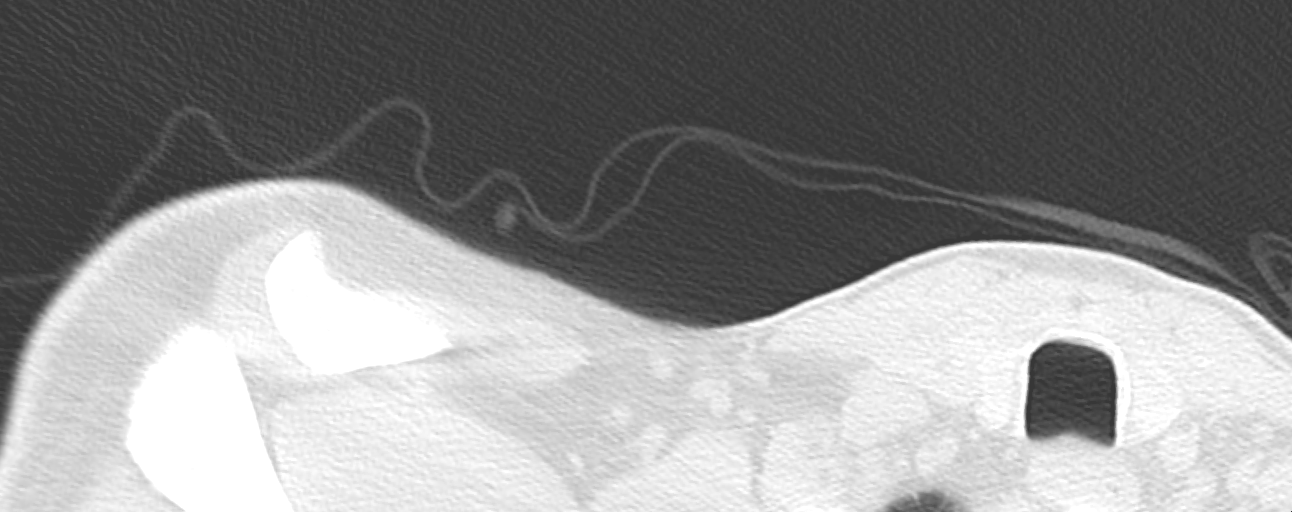

[14 of 30 positions shown; findings below may reference images not displayed]

FINDINGS: Cardiovascular: Normal heart size. No pericardial effusion. No
thoracic aortic aneurysm. Mild three-vessel coronary artery
atherosclerotic calcification.

Mediastinum/Nodes: No enlarged mediastinal or axillary lymph nodes.
Thyroid gland, trachea, and esophagus demonstrate no significant
findings.

Lungs/Pleura: Lungs are clear. No pleural effusion or pneumothorax.

Upper Abdomen: No acute abnormality.

Musculoskeletal: Skin marker overlying the right mid clavicle. No
chest wall mass or suspicious bone lesions identified. Mild
bilateral acromioclavicular osteoarthritis. Bilateral gynecomastia.
IMPRESSION: 1. Normal right clavicle.  No soft tissue mass or bone lesion.
2. Mild three-vessel coronary artery atherosclerotic calcification.

## 2023-08-15 ENCOUNTER — Other Ambulatory Visit (INDEPENDENT_AMBULATORY_CARE_PROVIDER_SITE_OTHER): Payer: Self-pay

## 2023-08-15 ENCOUNTER — Ambulatory Visit (INDEPENDENT_AMBULATORY_CARE_PROVIDER_SITE_OTHER): Payer: BC Managed Care – PPO | Admitting: Orthopedic Surgery

## 2023-08-15 ENCOUNTER — Encounter: Payer: Self-pay | Admitting: Orthopedic Surgery

## 2023-08-15 DIAGNOSIS — M25532 Pain in left wrist: Secondary | ICD-10-CM

## 2023-08-15 NOTE — Progress Notes (Signed)
Office Visit Note   Patient: Allen [name]           Date of Birth: [birth year]           MRN: [id number] Visit Date: 08/15/2023 Requested by: Roger Kill, PA-C [id number]  [place] [address] PCP: [facility]  Subjective: Chief Complaint  Patient presents with   Left Wrist - Pain    HPI: Allen [name] is a 43 y.o. male who presents to the office reporting left wrist pain of 6 weeks duration.  He was changing a tire when lifting a tire including into an SUV.  Felt a pop in the lateral wrist when he was lifting a car.  Had some swelling and bruising extending down the ulnar 4 days later.  Hurts for him to clench his fist.  Supination is also painful.  Did wear a brace for 2 weeks..  Patient is right-hand dominant.  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  Also likes to workout 5 days a week.              ROS: All systems reviewed are negative as they relate to the chief complaint within the history of present illness.  Patient denies fevers or chills.  Assessment & Plan: Visit Diagnoses:  1. Pain in left wrist     Plan: Impression is left wrist pain with likely TFCC tear.  DRUJ feels stable.  FCU and ECU tendons intact.  Subluxation of the ECU is also a possibility but that is not appreciated on exam today.  Need MRI arthrogram left wrist because of asymmetric swelling in the DRUJ with pain with supination and bruising and ecchymosis immediately in the postinjury.  Plain radiographs tomorrow.  Follow-up after that study.  Follow-Up Instructions: No follow-ups on file.   Orders:  Orders Placed This Encounter  Procedures   XR Wrist Complete Left   MR Wrist Left w/ contrast   Arthrogram   No orders of the defined types were placed in this encounter.     Procedures: No procedures performed   Clinical Data: No additional findings.  Objective: Vital Signs: There were no vitals taken for this visit.  Physical Exam:   Constitutional: Patient appears well-developed HEENT:  Head: Normocephalic Eyes:EOM are normal Neck: Normal range of motion Cardiovascular: Normal rate Pulmonary/chest: Effort normal Neurologic: Patient is alert Skin: Skin is warm Psychiatric: Patient has normal mood and affect  Ortho Exam: Ortho exam demonstrates definite pain and some limitation of supination on the left compared to the right at 50 degrees versus 70 degrees on the right-hand side.  Pronation is full.  DRUJ feels stable.  Patient does have asymmetric swelling around the TFCC.  FCU and ECU tendons palpable and intact on the left-hand side with no evidence of subluxation when taking the wrist through pronation and supination.  Specialty Comments:  No specialty comments available.  Imaging: XR Wrist Complete Left Result Date: 08/15/2023 AP lateral oblique radiographs left wrist reviewed.  No fracture.  Radiocarpal alignment intact.  Scapholunate ligament intact.    PMFS History: There are no active problems to display for this patient.  Past Medical History:  Diagnosis Date   Medical history non-contributory     History reviewed. No pertinent family history.  Past Surgical History:  Procedure Laterality Date   ANKLE RECONSTRUCTION Left 2000   ANTERIOR CRUCIATE LIGAMENT REPAIR Right 2006   FISTULOTOMY N/A 04/07/2021   Procedure: EXCISION OF ANAL  CYST;  Surgeon: Romie Levee, MD;  Location: [facility];  Service: General;  Laterality: N/A;   INCISION AND DRAINAGE ABSCESS ANAL     RECTAL EXAM UNDER ANESTHESIA N/A 04/07/2021   Procedure: RECTAL EXAM UNDER ANESTHESIA;  Surgeon: Romie Levee, MD;  Location: [facility];  Service: General;  Laterality: N/A;   Social History   Occupational History   Not on file  Tobacco Use   Smoking status: Every Day    Current packs/day: 1.00    Types: Cigarettes   Smokeless tobacco: Never  Vaping Use   Vaping status: Never Used  Substance  and Sexual Activity   Alcohol use: Not Currently   Drug use: Never   Sexual activity: Yes

## 2023-09-04 ENCOUNTER — Encounter: Payer: Self-pay | Admitting: Orthopedic Surgery

## 2023-09-14 ENCOUNTER — Other Ambulatory Visit: Payer: BC Managed Care – PPO

## 2023-09-14 ENCOUNTER — Inpatient Hospital Stay: Admission: RE | Admit: 2023-09-14 | Payer: BC Managed Care – PPO | Source: Ambulatory Visit
